# Patient Record
Sex: Female | Born: 1975 | Hispanic: No | Marital: Married | Smoking: Never smoker
Health system: Southern US, Community
[De-identification: ages and names within clinical notes are randomized; demographics above are authoritative.]

## PROBLEM LIST (undated history)

## (undated) DIAGNOSIS — E559 Vitamin D deficiency, unspecified: Secondary | ICD-10-CM

---

## 2006-07-29 ENCOUNTER — Ambulatory Visit: Payer: Self-pay | Admitting: Internal Medicine

## 2011-07-25 ENCOUNTER — Other Ambulatory Visit: Payer: Self-pay | Admitting: Obstetrics and Gynecology

## 2011-08-02 ENCOUNTER — Emergency Department: Payer: Self-pay | Admitting: Emergency Medicine

## 2013-04-28 ENCOUNTER — Other Ambulatory Visit: Payer: Self-pay | Admitting: Obstetrics and Gynecology

## 2013-04-28 LAB — COMPREHENSIVE METABOLIC PANEL
ALT: 25 U/L (ref 12–78)
AST: 12 U/L — AB (ref 15–37)
Albumin: 3.7 g/dL (ref 3.4–5.0)
Alkaline Phosphatase: 44 U/L — ABNORMAL LOW
Anion Gap: 3 — ABNORMAL LOW (ref 7–16)
BUN: 16 mg/dL (ref 7–18)
Bilirubin,Total: 0.3 mg/dL (ref 0.2–1.0)
CHLORIDE: 108 mmol/L — AB (ref 98–107)
CO2: 29 mmol/L (ref 21–32)
Calcium, Total: 8.5 mg/dL (ref 8.5–10.1)
Creatinine: 0.58 mg/dL — ABNORMAL LOW (ref 0.60–1.30)
EGFR (African American): >60
EGFR (Non-African Amer.): >60
Glucose: 71 mg/dL (ref 65–99)
OSMOLALITY: 279 (ref 275–301)
Potassium: 3.6 mmol/L (ref 3.5–5.1)
SODIUM: 140 mmol/L (ref 136–145)
TOTAL PROTEIN: 7.6 g/dL (ref 6.4–8.2)

## 2013-04-28 LAB — TSH: Thyroid Stimulating Horm: 0.834 u[IU]/mL

## 2013-04-28 LAB — CBC WITH DIFFERENTIAL/PLATELET
Basophil #: 0 10*3/uL (ref 0.0–0.1)
Basophil %: 0.8 %
Eosinophil #: 0.1 10*3/uL (ref 0.0–0.7)
Eosinophil %: 0.9 %
HCT: 40.8 % (ref 35.0–47.0)
HGB: 13.7 g/dL (ref 12.0–16.0)
Lymphocyte #: 1 10*3/uL (ref 1.0–3.6)
Lymphocyte %: 16.7 %
MCH: 31.8 pg (ref 26.0–34.0)
MCHC: 33.6 g/dL (ref 32.0–36.0)
MCV: 95 fL (ref 80–100)
Monocyte #: 0.4 x10 3/mm (ref 0.2–0.9)
Monocyte %: 7.2 %
NEUTROS ABS: 4.5 10*3/uL (ref 1.4–6.5)
Neutrophil %: 74.4 %
Platelet: 168 10*3/uL (ref 150–440)
RBC: 4.31 10*6/uL (ref 3.80–5.20)
RDW: 13.5 % (ref 11.5–14.5)
WBC: 6 10*3/uL (ref 3.6–11.0)

## 2013-04-28 LAB — HEPATIC FUNCTION PANEL A (ARMC): Bilirubin, Direct: <0.1 mg/dL (ref 0.00–0.20)

## 2013-04-30 LAB — LIPID PANEL
Cholesterol: 140 mg/dL (ref 0–200)
HDL Cholesterol: 64 mg/dL — ABNORMAL HIGH (ref 40–60)
LDL CHOLESTEROL, CALC: 59 mg/dL (ref 0–100)
Triglycerides: 86 mg/dL (ref 0–200)
VLDL Cholesterol, Calc: 17 mg/dL (ref 5–40)

## 2015-03-16 ENCOUNTER — Other Ambulatory Visit: Payer: Self-pay | Admitting: Physician Assistant

## 2015-03-16 DIAGNOSIS — Z1231 Encounter for screening mammogram for malignant neoplasm of breast: Secondary | ICD-10-CM

## 2015-03-23 ENCOUNTER — Ambulatory Visit
Admission: RE | Admit: 2015-03-23 | Discharge: 2015-03-23 | Disposition: A | Discharge status: Home / Self Care | Payer: Managed Care, Other (non HMO) | Source: Ambulatory Visit | Attending: Physician Assistant | Admitting: Physician Assistant

## 2015-03-23 DIAGNOSIS — Z1231 Encounter for screening mammogram for malignant neoplasm of breast: Secondary | ICD-10-CM | POA: Insufficient documentation

## 2016-03-23 ENCOUNTER — Other Ambulatory Visit: Payer: Self-pay | Admitting: Physician Assistant

## 2016-03-23 DIAGNOSIS — Z1231 Encounter for screening mammogram for malignant neoplasm of breast: Secondary | ICD-10-CM

## 2016-04-25 ENCOUNTER — Ambulatory Visit
Admission: RE | Admit: 2016-04-25 | Discharge: 2016-04-25 | Disposition: A | Discharge status: Home / Self Care | Payer: Managed Care, Other (non HMO) | Source: Ambulatory Visit | Attending: Physician Assistant | Admitting: Physician Assistant

## 2016-04-25 DIAGNOSIS — Z1231 Encounter for screening mammogram for malignant neoplasm of breast: Secondary | ICD-10-CM | POA: Insufficient documentation

## 2017-03-27 ENCOUNTER — Other Ambulatory Visit: Payer: Self-pay | Admitting: Physician Assistant

## 2017-03-27 DIAGNOSIS — Z1231 Encounter for screening mammogram for malignant neoplasm of breast: Secondary | ICD-10-CM

## 2017-04-29 ENCOUNTER — Ambulatory Visit
Admission: RE | Admit: 2017-04-29 | Discharge: 2017-04-29 | Disposition: A | Discharge status: Home / Self Care | Payer: Managed Care, Other (non HMO) | Source: Ambulatory Visit | Attending: Physician Assistant | Admitting: Physician Assistant

## 2017-04-29 DIAGNOSIS — Z1231 Encounter for screening mammogram for malignant neoplasm of breast: Secondary | ICD-10-CM

## 2017-05-07 ENCOUNTER — Other Ambulatory Visit: Payer: Self-pay | Admitting: Physician Assistant

## 2017-05-07 DIAGNOSIS — N6489 Other specified disorders of breast: Secondary | ICD-10-CM

## 2017-05-07 DIAGNOSIS — R928 Other abnormal and inconclusive findings on diagnostic imaging of breast: Secondary | ICD-10-CM

## 2017-05-20 ENCOUNTER — Ambulatory Visit
Admission: RE | Admit: 2017-05-20 | Discharge: 2017-05-20 | Disposition: A | Discharge status: Home / Self Care | Payer: Managed Care, Other (non HMO) | Source: Ambulatory Visit | Attending: Physician Assistant | Admitting: Physician Assistant

## 2017-05-20 DIAGNOSIS — R928 Other abnormal and inconclusive findings on diagnostic imaging of breast: Secondary | ICD-10-CM

## 2017-05-20 DIAGNOSIS — N6001 Solitary cyst of right breast: Secondary | ICD-10-CM | POA: Diagnosis not present

## 2017-05-20 DIAGNOSIS — N6489 Other specified disorders of breast: Secondary | ICD-10-CM | POA: Diagnosis present

## 2018-04-08 ENCOUNTER — Other Ambulatory Visit: Payer: Self-pay | Admitting: Physician Assistant

## 2018-04-08 DIAGNOSIS — Z1231 Encounter for screening mammogram for malignant neoplasm of breast: Secondary | ICD-10-CM

## 2018-04-30 ENCOUNTER — Ambulatory Visit
Admission: RE | Admit: 2018-04-30 | Discharge: 2018-04-30 | Disposition: A | Discharge status: Home / Self Care | Payer: PRIVATE HEALTH INSURANCE | Source: Ambulatory Visit | Attending: Physician Assistant | Admitting: Physician Assistant

## 2018-04-30 DIAGNOSIS — Z1231 Encounter for screening mammogram for malignant neoplasm of breast: Secondary | ICD-10-CM | POA: Insufficient documentation

## 2019-04-02 ENCOUNTER — Other Ambulatory Visit: Payer: Self-pay | Admitting: Physician Assistant

## 2019-04-02 DIAGNOSIS — Z1231 Encounter for screening mammogram for malignant neoplasm of breast: Secondary | ICD-10-CM

## 2019-04-02 DIAGNOSIS — E049 Nontoxic goiter, unspecified: Secondary | ICD-10-CM

## 2019-04-16 ENCOUNTER — Ambulatory Visit
Admission: RE | Admit: 2019-04-16 | Discharge: 2019-04-16 | Disposition: A | Discharge status: Home / Self Care | Payer: PRIVATE HEALTH INSURANCE | Source: Ambulatory Visit | Attending: Physician Assistant | Admitting: Physician Assistant

## 2019-04-16 ENCOUNTER — Other Ambulatory Visit: Payer: Self-pay

## 2019-04-16 DIAGNOSIS — E049 Nontoxic goiter, unspecified: Secondary | ICD-10-CM

## 2019-06-01 ENCOUNTER — Ambulatory Visit
Admission: RE | Admit: 2019-06-01 | Discharge: 2019-06-01 | Disposition: A | Discharge status: Home / Self Care | Payer: PRIVATE HEALTH INSURANCE | Source: Ambulatory Visit | Attending: Physician Assistant | Admitting: Physician Assistant

## 2019-06-01 ENCOUNTER — Other Ambulatory Visit: Payer: Self-pay

## 2019-06-01 DIAGNOSIS — Z1231 Encounter for screening mammogram for malignant neoplasm of breast: Secondary | ICD-10-CM | POA: Diagnosis present

## 2019-06-05 ENCOUNTER — Other Ambulatory Visit: Payer: Self-pay | Admitting: Physician Assistant

## 2019-06-05 DIAGNOSIS — R928 Other abnormal and inconclusive findings on diagnostic imaging of breast: Secondary | ICD-10-CM

## 2019-06-05 DIAGNOSIS — N6320 Unspecified lump in the left breast, unspecified quadrant: Secondary | ICD-10-CM

## 2019-06-17 ENCOUNTER — Ambulatory Visit
Admission: RE | Admit: 2019-06-17 | Discharge: 2019-06-17 | Disposition: A | Discharge status: Home / Self Care | Payer: PRIVATE HEALTH INSURANCE | Source: Ambulatory Visit | Attending: Physician Assistant | Admitting: Physician Assistant

## 2019-06-17 DIAGNOSIS — R928 Other abnormal and inconclusive findings on diagnostic imaging of breast: Secondary | ICD-10-CM

## 2019-06-17 DIAGNOSIS — N6320 Unspecified lump in the left breast, unspecified quadrant: Secondary | ICD-10-CM

## 2020-05-20 ENCOUNTER — Other Ambulatory Visit: Payer: Self-pay | Admitting: Physician Assistant

## 2020-05-20 DIAGNOSIS — Z1231 Encounter for screening mammogram for malignant neoplasm of breast: Secondary | ICD-10-CM

## 2020-07-11 ENCOUNTER — Ambulatory Visit
Admission: RE | Admit: 2020-07-11 | Discharge: 2020-07-11 | Disposition: A | Discharge status: Home / Self Care | Payer: PRIVATE HEALTH INSURANCE | Source: Ambulatory Visit | Attending: Physician Assistant | Admitting: Physician Assistant

## 2020-07-11 ENCOUNTER — Other Ambulatory Visit: Payer: Self-pay

## 2020-07-11 DIAGNOSIS — Z1231 Encounter for screening mammogram for malignant neoplasm of breast: Secondary | ICD-10-CM

## 2021-01-27 ENCOUNTER — Ambulatory Visit: Admission: RE | Admit: 2021-01-27 | Payer: No Typology Code available for payment source | Source: Home / Self Care

## 2021-04-06 ENCOUNTER — Encounter: Payer: Self-pay | Admitting: *Deleted

## 2021-04-07 ENCOUNTER — Ambulatory Visit: Payer: BC Managed Care – PPO | Admitting: Anesthesiology

## 2021-04-07 ENCOUNTER — Other Ambulatory Visit: Payer: Self-pay

## 2021-04-07 ENCOUNTER — Encounter: Admission: RE | Payer: Self-pay | Source: Home / Self Care

## 2021-04-07 ENCOUNTER — Encounter
Admission: RE | Disposition: A | Discharge status: Home / Self Care | Payer: Self-pay | Source: Home / Self Care | Attending: Gastroenterology

## 2021-04-07 ENCOUNTER — Encounter: Payer: Self-pay | Admitting: *Deleted

## 2021-04-07 ENCOUNTER — Ambulatory Visit
Admission: RE | Admit: 2021-04-07 | Discharge: 2021-04-07 | Disposition: A | Discharge status: Home / Self Care | Payer: BC Managed Care – PPO | Attending: Gastroenterology | Admitting: Gastroenterology

## 2021-04-07 DIAGNOSIS — Z1211 Encounter for screening for malignant neoplasm of colon: Secondary | ICD-10-CM | POA: Insufficient documentation

## 2021-04-07 DIAGNOSIS — E559 Vitamin D deficiency, unspecified: Secondary | ICD-10-CM | POA: Insufficient documentation

## 2021-04-07 HISTORY — DX: Vitamin D deficiency, unspecified: E55.9

## 2021-04-07 HISTORY — PX: COLONOSCOPY WITH PROPOFOL: SHX5780

## 2021-04-07 LAB — POCT PREGNANCY, URINE: Preg Test, Ur: NEGATIVE

## 2021-04-07 SURGERY — COLONOSCOPY WITH PROPOFOL
Anesthesia: General

## 2021-04-07 MED ORDER — GLYCOPYRROLATE 0.2 MG/ML IJ SOLN
INTRAMUSCULAR | Status: AC
  Filled 2021-04-07: qty 3

## 2021-04-07 MED ORDER — LIDOCAINE HCL (PF) 2 % IJ SOLN
INTRAMUSCULAR | Status: AC
  Filled 2021-04-07: qty 35

## 2021-04-07 MED ORDER — LIDOCAINE HCL (CARDIAC) PF 100 MG/5ML IV SOSY
PREFILLED_SYRINGE | INTRAVENOUS | Status: DC | PRN
  Administered 2021-04-07: 50 mg via INTRAVENOUS

## 2021-04-07 MED ORDER — SODIUM CHLORIDE 0.9 % IV SOLN: INTRAVENOUS | Status: DC

## 2021-04-07 MED ORDER — EPHEDRINE 5 MG/ML INJ
INTRAVENOUS | Status: AC
  Filled 2021-04-07: qty 10

## 2021-04-07 MED ORDER — PROPOFOL 500 MG/50ML IV EMUL
INTRAVENOUS | Status: AC
  Filled 2021-04-07: qty 100

## 2021-04-07 MED ORDER — PROPOFOL 10 MG/ML IV BOLUS
INTRAVENOUS | Status: DC | PRN
Start: 2021-04-07 — End: 2021-04-07
  Administered 2021-04-07: 20 mg via INTRAVENOUS
  Administered 2021-04-07: 30 mg via INTRAVENOUS
  Administered 2021-04-07: 60 mg via INTRAVENOUS
  Administered 2021-04-07: 30 mg via INTRAVENOUS

## 2021-04-07 MED ORDER — PROPOFOL 500 MG/50ML IV EMUL
INTRAVENOUS | Status: DC | PRN
  Administered 2021-04-07: 140 ug/kg/min via INTRAVENOUS

## 2021-04-07 NOTE — Anesthesia Preprocedure Evaluation (Signed)
Anesthesia Evaluation  Patient identified by MRN, date of birth, ID band Patient awake    Reviewed: Allergy & Precautions, NPO status , Patient's Chart, lab work & pertinent test results  History of Anesthesia Complications Negative for: history of anesthetic complications  Airway Mallampati: III  TM Distance: >3 FB Neck ROM: full    Dental  (+) Chipped   Pulmonary neg pulmonary ROS, neg shortness of breath,    Pulmonary exam normal        Cardiovascular (-) angina(-) DOE negative cardio ROS Normal cardiovascular exam     Neuro/Psych negative neurological ROS  negative psych ROS   GI/Hepatic negative GI ROS, Neg liver ROS, neg GERD  ,  Endo/Other    Renal/GU negative Renal ROS     Musculoskeletal   Abdominal   Peds  Hematology negative hematology ROS (+)   Anesthesia Other Findings Past Medical History: No date: Vitamin D deficiency  History reviewed. No pertinent surgical history.  BMI    Body Mass Index: 21.03 kg/m      Reproductive/Obstetrics negative OB ROS                             Anesthesia Physical Anesthesia Plan  ASA: 2  Anesthesia Plan: General   Post-op Pain Management:    Induction: Intravenous  PONV Risk Score and Plan: Propofol infusion and TIVA  Airway Management Planned: Natural Airway and Nasal Cannula  Additional Equipment:   Intra-op Plan:   Post-operative Plan:   Informed Consent: I have reviewed the patients History and Physical, chart, labs and discussed the procedure including the risks, benefits and alternatives for the proposed anesthesia with the patient or authorized representative who has indicated his/her understanding and acceptance.     Dental Advisory Given  Plan Discussed with: Anesthesiologist, CRNA and Surgeon  Anesthesia Plan Comments: (Patient declines interpreter   Patient consented for risks of anesthesia including  but not limited to:  - adverse reactions to medications - risk of airway placement if required - damage to eyes, teeth, lips or other oral mucosa - nerve damage due to positioning  - sore throat or hoarseness - Damage to heart, brain, nerves, lungs, other parts of body or loss of life  Patient voiced understanding.)        Anesthesia Quick Evaluation

## 2021-04-07 NOTE — Op Note (Signed)
[facility] Patient Name: Shelly Boyle Procedure Date: 04/07/2021 10:22 AM MRN: [id number] Account #: [id number] Date of Birth: Feb 16, 1976 Admit Type: Outpatient Age: 46 Room: [facility] ENDO ROOM 3 Gender: Female Note Status: Finalized Instrument Name: Peds Colonoscope [id number] Procedure:             Colonoscopy Indications:           Screening for colorectal malignant neoplasm Providers:             Andrey Farmer MD, MD Medicines:             Monitored Anesthesia Care Complications:         No immediate complications. Procedure:             Pre-Anesthesia Assessment:                        - Prior to the procedure, a History and Physical was                         performed, and patient medications and allergies were                         reviewed. The patient is competent. The risks and                         benefits of the procedure and the sedation options and                         risks were discussed with the patient. All questions                         were answered and informed consent was obtained.                         Patient identification and proposed procedure were                         verified by the physician, the nurse, the                         anesthesiologist, the anesthetist and the technician                         in the endoscopy suite. Mental Status Examination:                         alert and oriented. Airway Examination: normal                         oropharyngeal airway and neck mobility. Respiratory                         Examination: clear to auscultation. CV Examination:                         normal. Prophylactic Antibiotics: The patient does not                         require prophylactic antibiotics. Prior  Anticoagulants: The patient has taken no previous                         anticoagulant or antiplatelet agents. ASA Grade                         Assessment: I - A  normal, healthy patient. After                         reviewing the risks and benefits, the patient was                         deemed in satisfactory condition to undergo the                         procedure. The anesthesia plan was to use monitored                         anesthesia care (MAC). Immediately prior to                         administration of medications, the patient was                         re-assessed for adequacy to receive sedatives. The                         heart rate, respiratory rate, oxygen saturations,                         blood pressure, adequacy of pulmonary ventilation, and                         response to care were monitored throughout the                         procedure. The physical status of the patient was                         re-assessed after the procedure.                        After obtaining informed consent, the colonoscope was                         passed under direct vision. Throughout the procedure,                         the patient's blood pressure, pulse, and oxygen                         saturations were monitored continuously. The                         Colonoscope was introduced through the anus and                         advanced to the the terminal ileum. The colonoscopy  was performed without difficulty. The patient                         tolerated the procedure well. The quality of the bowel                         preparation was good. Findings:      The perianal and digital rectal examinations were normal.      The terminal ileum appeared normal.      The entire examined colon appeared normal on direct and retroflexion       views. Impression:            - The examined portion of the ileum was normal.                        - The entire examined colon is normal on direct and                         retroflexion views.                        - No specimens collected. Recommendation:         - Discharge patient to home.                        - Resume previous diet.                        - Continue present medications.                        - Repeat colonoscopy in 10 years for screening                         purposes.                        - Return to referring physician as previously                         scheduled. Procedure Code(s):     --- Professional ---                        F6384, Colorectal cancer screening; colonoscopy on                         individual not meeting criteria for high risk Diagnosis Code(s):     --- Professional ---                        Z12.11, Encounter for screening for malignant neoplasm                         of colon CPT copyright 2019 [employer]. All rights reserved. The codes documented in this report are preliminary and upon coder review may  be revised to meet current compliance requirements. Andrey Farmer MD, MD 04/07/2021 10:57:31 AM Number of Addenda: 0 Note Initiated On: 04/07/2021 10:22 AM Scope Withdrawal Time: 0 hours 10 minutes 29 seconds  Total Procedure Duration: 0 hours 19 minutes 28 seconds  Estimated  Blood Loss:  Estimated blood loss: none.      [facility]

## 2021-04-07 NOTE — Interval H&P Note (Signed)
History and Physical Interval Note:  04/07/2021 10:26 AM  Shelly Boyle  has presented today for surgery, with the diagnosis of screening.  The various methods of treatment have been discussed with the patient and family. After consideration of risks, benefits and other options for treatment, the patient has consented to  Procedure(s): COLONOSCOPY WITH PROPOFOL (N/A) as a surgical intervention.  The patient's history has been reviewed, patient examined, no change in status, stable for surgery.  I have reviewed the patient's chart and labs.  Questions were answered to the patient's satisfaction.     Regis Bill  Ok to proceed with colonoscopy

## 2021-04-07 NOTE — Transfer of Care (Signed)
Immediate Anesthesia Transfer of Care Note  Patient: Shelly Boyle  Procedure(s) Performed: COLONOSCOPY WITH PROPOFOL  Patient Location: Endoscopy Unit  Anesthesia Type:General  Level of Consciousness: sedated  Airway & Oxygen Therapy: Patient Spontanous Breathing  Post-op Assessment: Report given to RN and Post -op Vital signs reviewed and stable  Post vital signs: Reviewed and stable  Last Vitals:  Vitals Value Taken Time  BP 94/55   Temp    Pulse 85 04/07/21 1055  Resp 20 04/07/21 1055  SpO2 100 % 04/07/21 1055  Vitals shown include unvalidated device data.  Last Pain:  Vitals:   04/07/21 0945  TempSrc: Temporal  PainSc: 0-No pain         Complications: No notable events documented.

## 2021-04-07 NOTE — Anesthesia Procedure Notes (Signed)
Date/Time: 04/07/2021 10:28 AM Performed by: Joanette Gula, Nilson Tabora, CRNA Pre-anesthesia Checklist: Patient identified, Emergency Drugs available and Suction available Patient Re-evaluated:Patient Re-evaluated prior to induction Oxygen Delivery Method: Nasal cannula Induction Type: IV induction

## 2021-04-07 NOTE — H&P (Signed)
Outpatient short stay form Pre-procedure 04/07/2021  Regis Bill, MD  Primary Physician: Patrice Paradise, MD  Reason for visit:  Screening  History of present illness:   46 y/o lady with no significant medical history here for screening colonoscopy. No blood thinners. No family history of GI malignancies. No abdominal surgeries. No significant symptoms.    Current Facility-Administered Medications:    0.9 %  sodium chloride infusion, , Intravenous, Continuous, Camilo Mander, Rossie Muskrat, MD, Last Rate: 20 mL/hr at 04/07/21 1008, Continued from Pre-op at 04/07/21 1008  Medications Prior to Admission  Medication Sig Dispense Refill Last Dose   ascorbic acid (VITAMIN C) 500 MG tablet Take 500 mg by mouth daily.   Past Week   Cholecalciferol (D3 VITAMIN PO) Take by mouth daily.   Past Week   ergocalciferol (VITAMIN D2) 1.25 MG (50000 UT) capsule Take 50,000 Units by mouth once a week.   Past Week   Omega 3 340 MG CPDR Take by mouth 2 (two) times daily.   Past Week   predniSONE (DELTASONE) 10 MG tablet Take 10 mg by mouth daily with breakfast.      Prenatal Vit-DSS-Fe Fum-FA (PRENATAL 19) tablet Take 1 tablet by mouth daily.   Past Week     No Known Allergies   Past Medical History:  Diagnosis Date   Vitamin D deficiency     Review of systems:  Otherwise negative.    Physical Exam  Gen: Alert, oriented. Appears stated age.  HEENT: PERRLA. Lungs: No respiratory distress CV: RRR Abd: soft, benign, no masses Ext: No edema    Planned procedures: Proceed with colonoscopy. The patient understands the nature of the planned procedure, indications, risks, alternatives and potential complications including but not limited to bleeding, infection, perforation, damage to internal organs and possible oversedation/side effects from anesthesia. The patient agrees and gives consent to proceed.  Please refer to procedure notes for findings, recommendations and patient  disposition/instructions.     Regis Bill, MD [facility]astroenterology

## 2021-04-07 NOTE — Anesthesia Postprocedure Evaluation (Signed)
Anesthesia Post Note  Patient: Shelly Boyle  Procedure(s) Performed: COLONOSCOPY WITH PROPOFOL  Patient location during evaluation: Endoscopy Anesthesia Type: General Level of consciousness: awake and alert Pain management: pain level controlled Vital Signs Assessment: post-procedure vital signs reviewed and stable Respiratory status: spontaneous breathing, nonlabored ventilation, respiratory function stable and patient connected to nasal cannula oxygen Cardiovascular status: blood pressure returned to baseline and stable Postop Assessment: no apparent nausea or vomiting Anesthetic complications: no   No notable events documented.   Last Vitals:  Vitals:   04/07/21 1100 04/07/21 1110  BP: 99/66 (!) 92/48  Pulse: 82 76  Resp: 19 17  Temp:    SpO2: 100% 100%    Last Pain:  Vitals:   04/07/21 1050  TempSrc: Temporal  PainSc:                  Cleda Mccreedy Amiaya Mcneeley

## 2021-04-10 ENCOUNTER — Encounter: Payer: Self-pay | Admitting: Gastroenterology

## 2021-06-12 ENCOUNTER — Other Ambulatory Visit: Payer: Self-pay | Admitting: Physician Assistant

## 2021-06-12 DIAGNOSIS — Z1231 Encounter for screening mammogram for malignant neoplasm of breast: Secondary | ICD-10-CM

## 2021-07-19 ENCOUNTER — Ambulatory Visit
Admission: RE | Admit: 2021-07-19 | Discharge: 2021-07-19 | Disposition: A | Discharge status: Home / Self Care | Payer: BC Managed Care – PPO | Source: Ambulatory Visit | Attending: Physician Assistant | Admitting: Physician Assistant

## 2021-07-19 DIAGNOSIS — Z1231 Encounter for screening mammogram for malignant neoplasm of breast: Secondary | ICD-10-CM | POA: Diagnosis not present

## 2022-06-19 ENCOUNTER — Other Ambulatory Visit: Payer: Self-pay | Admitting: Physician Assistant

## 2022-06-19 DIAGNOSIS — Z1231 Encounter for screening mammogram for malignant neoplasm of breast: Secondary | ICD-10-CM

## 2022-07-23 ENCOUNTER — Ambulatory Visit
Admission: RE | Admit: 2022-07-23 | Discharge: 2022-07-23 | Disposition: A | Discharge status: Home / Self Care | Payer: BC Managed Care – PPO | Source: Ambulatory Visit | Attending: Physician Assistant | Admitting: Physician Assistant

## 2022-07-23 DIAGNOSIS — Z1231 Encounter for screening mammogram for malignant neoplasm of breast: Secondary | ICD-10-CM | POA: Insufficient documentation

## 2022-08-06 ENCOUNTER — Other Ambulatory Visit: Payer: Self-pay | Admitting: Family Medicine

## 2022-08-06 DIAGNOSIS — N63 Unspecified lump in unspecified breast: Secondary | ICD-10-CM

## 2022-08-06 DIAGNOSIS — R928 Other abnormal and inconclusive findings on diagnostic imaging of breast: Secondary | ICD-10-CM

## 2022-08-15 ENCOUNTER — Ambulatory Visit
Admission: RE | Admit: 2022-08-15 | Discharge: 2022-08-15 | Disposition: A | Discharge status: Home / Self Care | Payer: BC Managed Care – PPO | Source: Ambulatory Visit | Attending: Family Medicine | Admitting: Family Medicine

## 2022-08-15 DIAGNOSIS — N63 Unspecified lump in unspecified breast: Secondary | ICD-10-CM | POA: Insufficient documentation

## 2022-08-15 DIAGNOSIS — R928 Other abnormal and inconclusive findings on diagnostic imaging of breast: Secondary | ICD-10-CM

## 2023-07-15 ENCOUNTER — Other Ambulatory Visit: Payer: Self-pay | Admitting: Physician Assistant

## 2023-07-15 DIAGNOSIS — Z1231 Encounter for screening mammogram for malignant neoplasm of breast: Secondary | ICD-10-CM

## 2023-07-19 IMAGING — MG MM DIGITAL SCREENING BILAT W/ TOMO AND CAD
8 series · 9 of 24 positions shown · non-contrast
Comparison: Previous exam(s).

CLINICAL DATA: Screening.

EXAM:
DIGITAL SCREENING BILATERAL MAMMOGRAM WITH TOMOSYNTHESIS AND CAD
TECHNIQUE: Bilateral screening digital craniocaudal and mediolateral oblique
mammograms were obtained. Bilateral screening digital breast
tomosynthesis was performed. The images were evaluated with
computer-aided detection.

[R CC synth-2D]
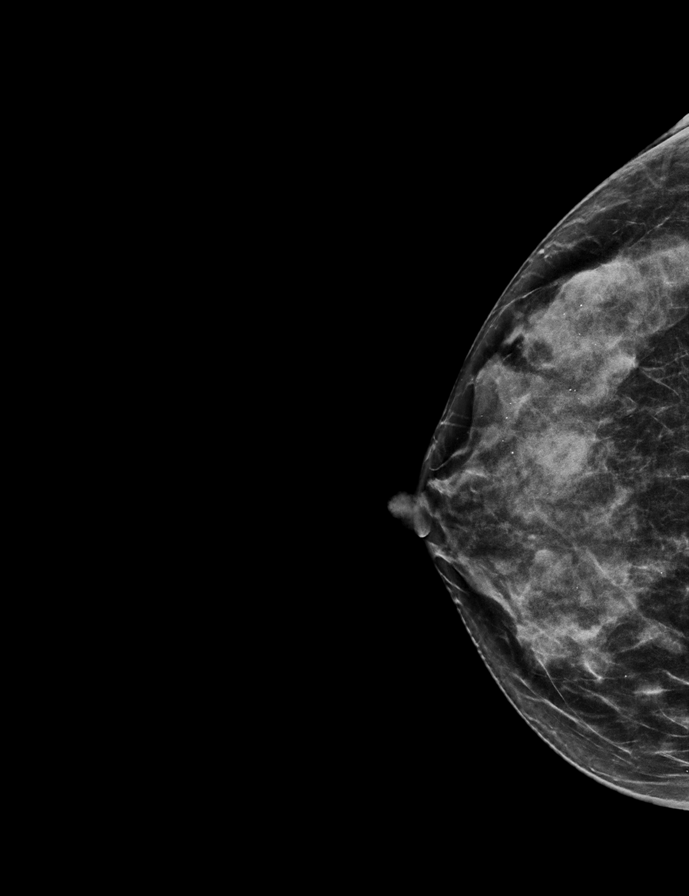

[L CC synth-2D]
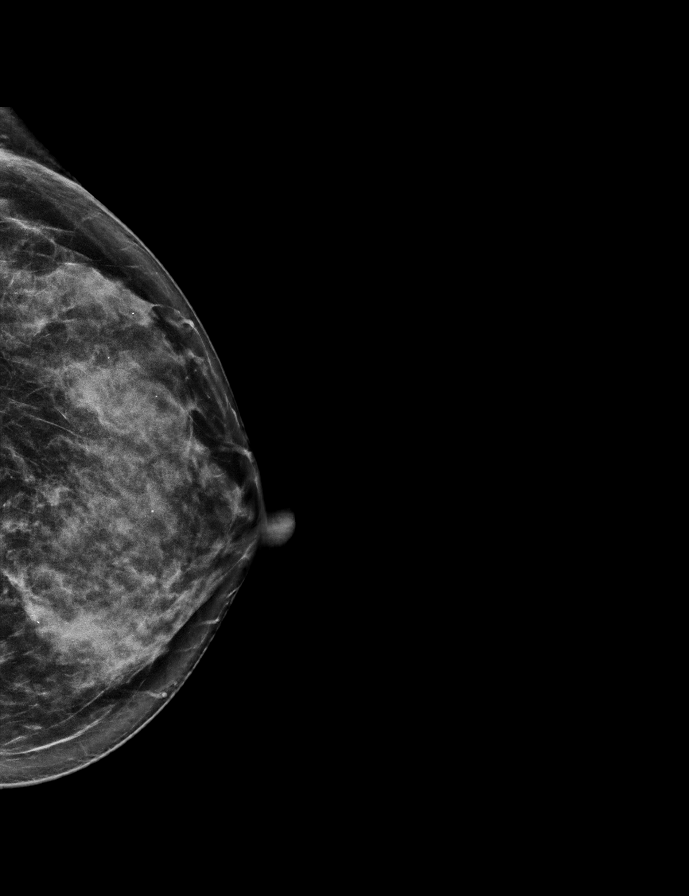

[L MLO synth-2D]
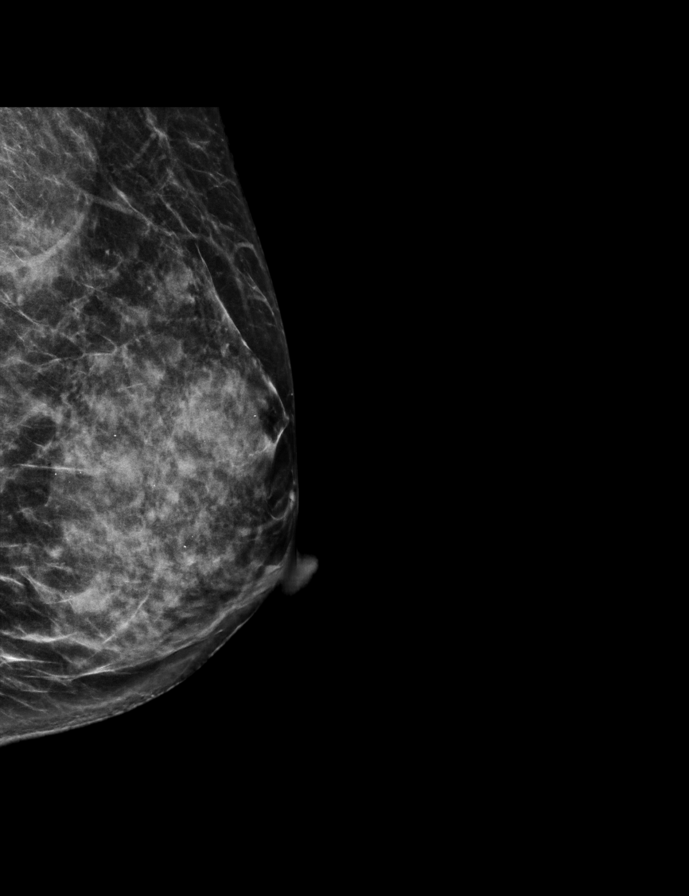

[R MLO synth-2D]
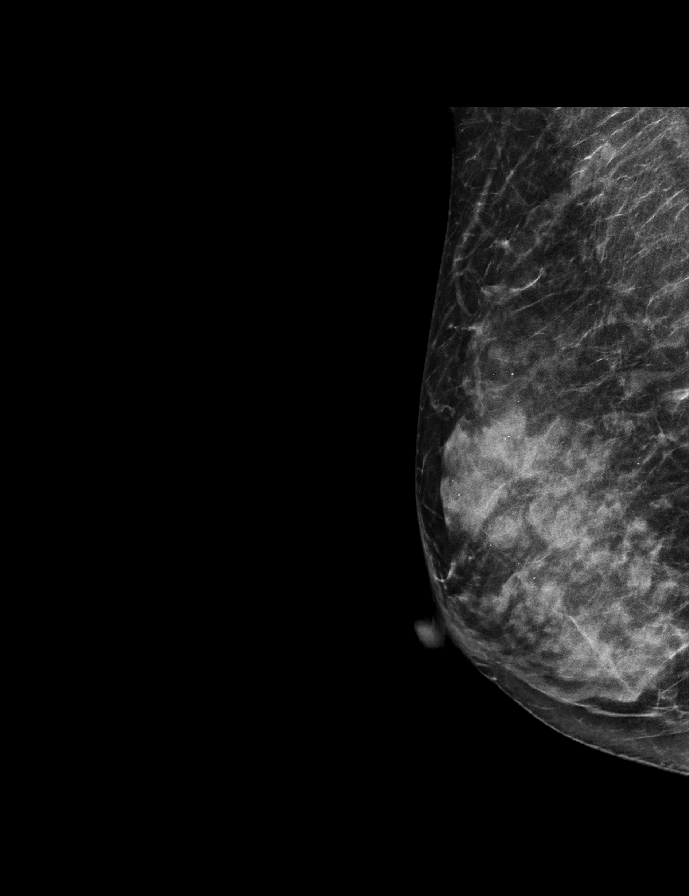

[L MLO tomo · 2 of 55 frames shown]
[frame 18/55]
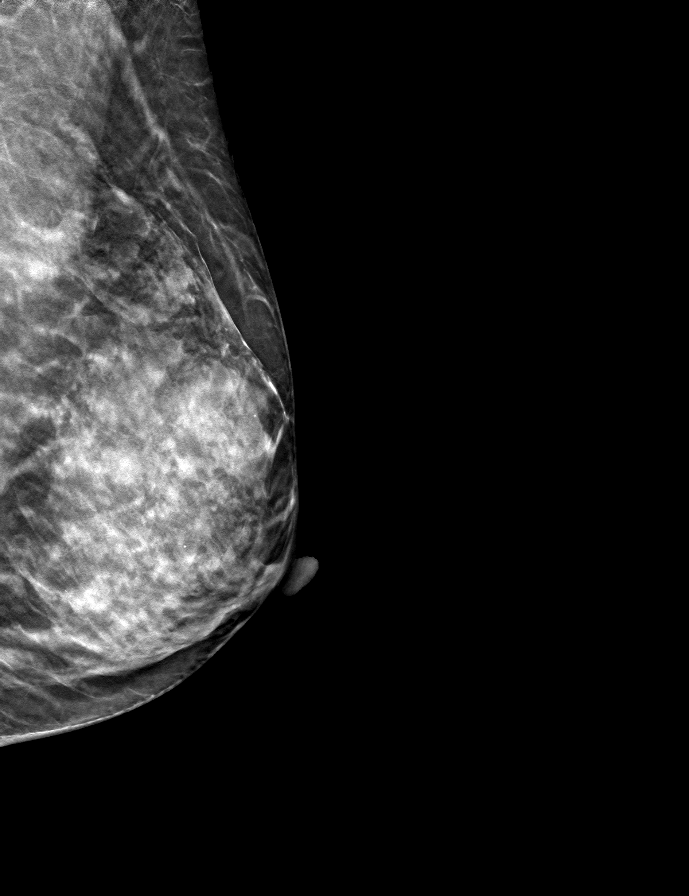
[frame 28/55]
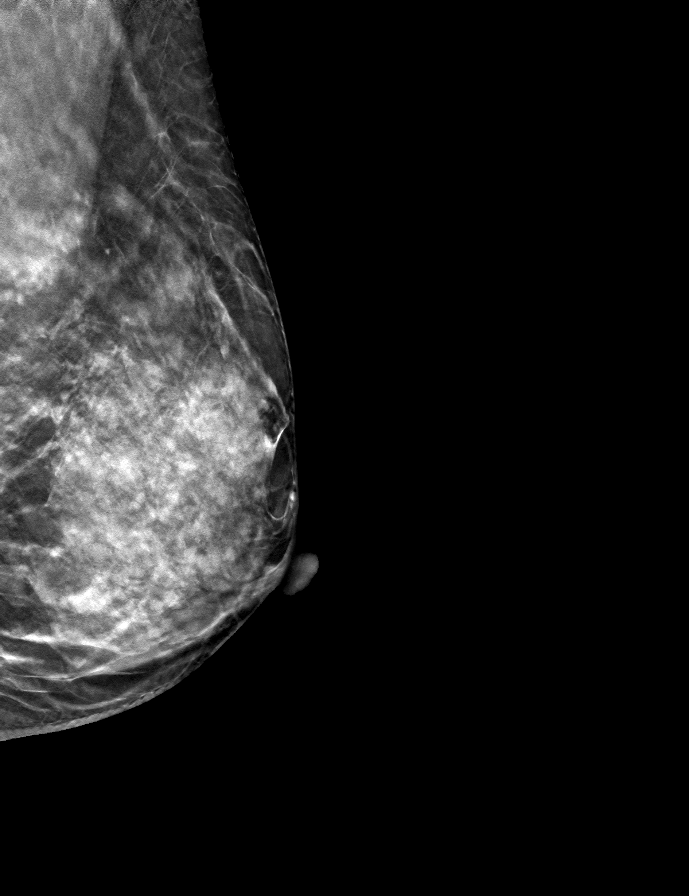

[L CC tomo · tomo slice 29/58.0]
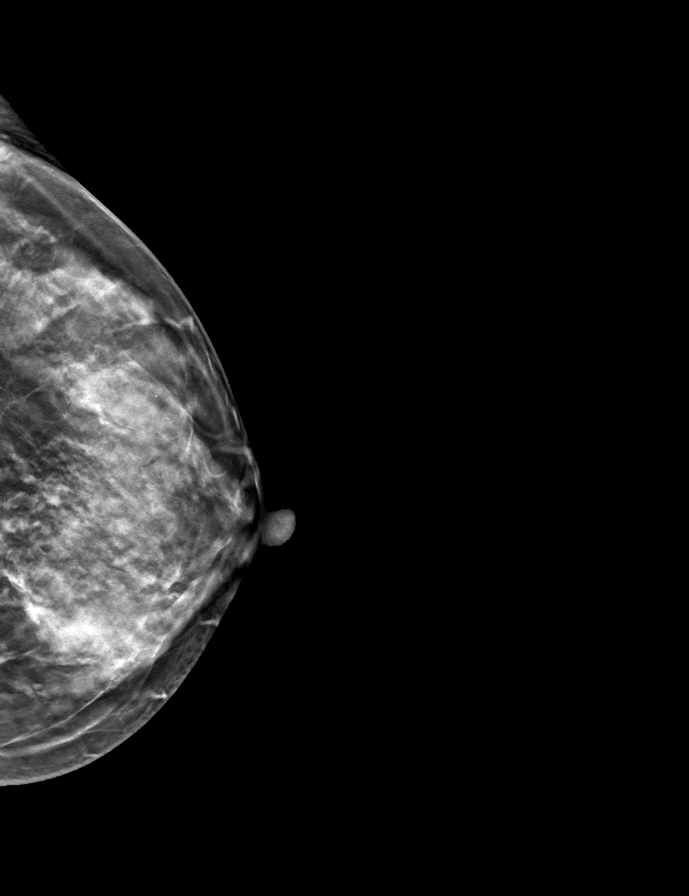

[R CC tomo · tomo slice 28/55.0]
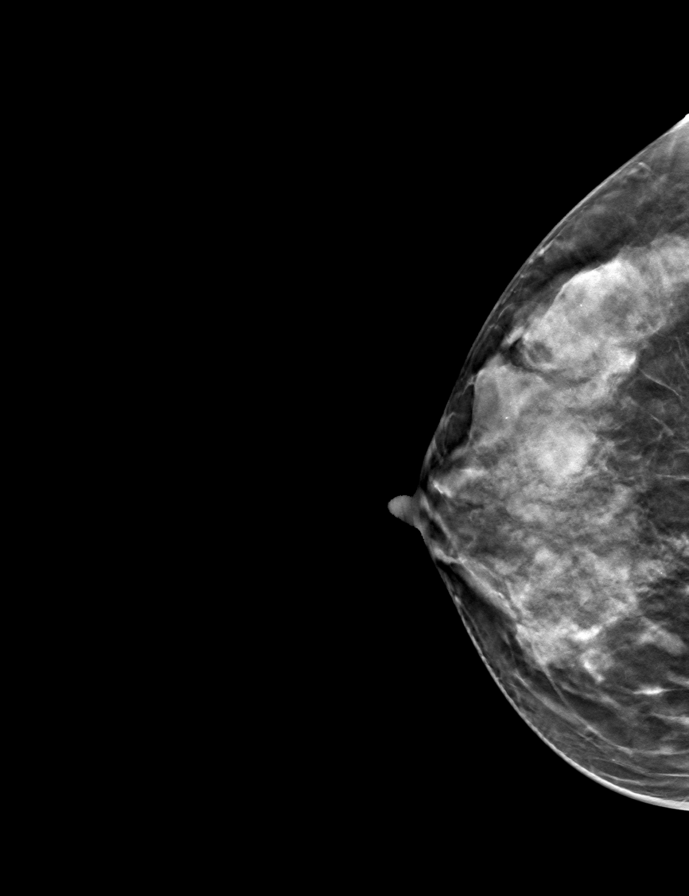

[R MLO tomo · tomo slice 29/58.0]
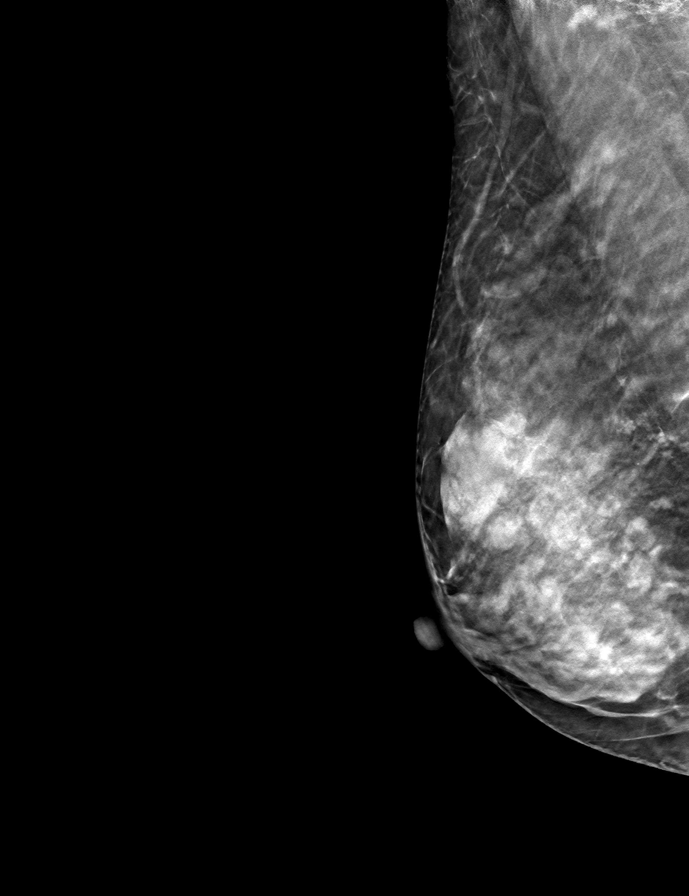

[9 of 24 positions shown; findings below may reference images not displayed]

ACR Breast Density Category c: The breast tissue is heterogeneously
dense, which may obscure small masses.
FINDINGS: There are no findings suspicious for malignancy.
IMPRESSION: No mammographic evidence of malignancy. A result letter of this
screening mammogram will be mailed directly to the patient.

RECOMMENDATION:
Screening mammogram in one year. (Code:[id number])

BI-RADS CATEGORY  1: Negative.

## 2023-07-25 ENCOUNTER — Ambulatory Visit
Admission: RE | Admit: 2023-07-25 | Discharge: 2023-07-25 | Disposition: A | Discharge status: Home / Self Care | Source: Ambulatory Visit | Attending: Physician Assistant | Admitting: Physician Assistant

## 2023-07-25 DIAGNOSIS — Z1231 Encounter for screening mammogram for malignant neoplasm of breast: Secondary | ICD-10-CM | POA: Insufficient documentation
